# Patient Record
Sex: Female | Born: 2000 | Race: White | Hispanic: No | Marital: Single | Smoking: Never smoker
Health system: Southern US, Community
[De-identification: ages and names within clinical notes are randomized; demographics above are authoritative.]

---
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
## 2023-07-10 ENCOUNTER — Ambulatory Visit
Admission: EM | Admit: 2023-07-10 | Discharge: 2023-07-10 | Disposition: A | Discharge status: Home / Self Care | Payer: No Typology Code available for payment source | Attending: Family Medicine | Admitting: Family Medicine

## 2023-07-10 DIAGNOSIS — H9202 Otalgia, left ear: Secondary | ICD-10-CM

## 2023-07-10 NOTE — ED Provider Notes (Signed)
[facility]    CSN: [id number] Arrival date & time: 07/10/23  1501      History   Chief Complaint Chief Complaint  Patient presents with   Ear Pain    HPI Sarah Gross is a 22 y.o. female.   HPI   Gift presents for left ear pain with associated pressure.  No fever, cold symptoms. No recent swimming. Dad wanted to be sure she didn't have an ear infection prior to Marvin leaving for town tomorrow.  No treatment prior to arrival.    Sarah Gross has otherwise been well and has no other concerns.      History reviewed. No pertinent past medical history.  There are no problems to display for this patient.   History reviewed. No pertinent surgical history.  OB History   No obstetric history on file.      Home Medications    Prior to Admission medications   Medication Sig Start Date End Date Taking? Authorizing Provider  sertraline (ZOLOFT) 50 MG tablet Take 50 mg by mouth daily.    [provider]    Family History History reviewed. No pertinent family history.  Social History Social History   Tobacco Use   Smoking status: Never   Smokeless tobacco: Never  Vaping Use   Vaping status: Never Used  Substance Use Topics   Alcohol use: Never   Drug use: Never     Allergies   Patient has no known allergies.   Review of Systems Review of Systems: :negative unless otherwise stated in HPI.      Physical Exam Triage Vital Signs ED Triage Vitals  Encounter Vitals Group     BP 07/10/23 1514 115/75     Systolic BP Percentile --      Diastolic BP Percentile --      Pulse Rate 07/10/23 1514 92     Resp --      Temp 07/10/23 1514 99.1 F (37.3 C)     Temp Source 07/10/23 1514 Oral     SpO2 07/10/23 1514 97 %     Weight --      Height --      Head Circumference --      Peak Flow --      Pain Score 07/10/23 1512 6     Pain Loc --      Pain Education --      Exclude from Growth Chart --    No data found.  Updated Vital  Signs BP 115/75 (BP Location: Left Arm)   Pulse 92   Temp 99.1 F (37.3 C) (Oral)   LMP 07/08/2023   SpO2 97%   Visual Acuity Right Eye Distance:   Left Eye Distance:   Bilateral Distance:    Right Eye Near:   Left Eye Near:    Bilateral Near:     Physical Exam GEN:     alert, well appearing female in no distress    HENT:  mucus membranes moist, no nasal discharge, right TM effusion but no purulence or , left TM normal, normal external auditory canals bilaterally, nontender tragus, tenderness at TM joint with palpation EYES:   no scleral injection or lesions  NECK:  normal ROM, no lymphadenopathy, no meningismus   RESP:  no increased work of breathing CVS:   regular rate, brisk cap refill  Skin:   warm and dry    UC Treatments / Results  Labs (all labs ordered are listed, but only abnormal results  are displayed) Labs Reviewed - No data to display  EKG   Radiology No results found.  Procedures Procedures (including critical care time)  Medications Ordered in UC Medications - No data to display  Initial Impression / Assessment and Plan / UC Course  I have reviewed the triage vital signs and the nursing notes.  Pertinent labs & imaging results that were available during my care of the patient were reviewed by me and considered in my medical decision making (see chart for details).      Overall patient is well-appearing, well-hydrated and without respiratory distress. Sarah Gross is afebrile. She has no evidence of acute otitis media or externa.  On exam, she has TTP at the TM joint.  Suspect referred pain.  Treat with 400-600 mg Motrin as needed for pain.     Discussed MDM, treatment plan and plan for follow-up with patient and her dad who agree with plan.   Final Clinical Impressions(s) / UC Diagnoses   Final diagnoses:  Left ear pain     Discharge Instructions      You did not have evidence of an outer or inner ear infection.  Take ibuprofen 400-600 mg  3-4 times a day for the next week.  If pain suddenly worsens, go to the ED.  If pain continues, follow up with a ear, nose and throat provider.      ED Prescriptions   None    PDMP not reviewed this encounter.   Katha Cabal, DO 07/10/23 1637

## 2023-07-10 NOTE — Discharge Instructions (Signed)
You did not have evidence of an outer or inner ear infection.  Take ibuprofen 400-600 mg 3-4 times a day for the next week.  If pain suddenly worsens, go to the ED.  If pain continues, follow up with a ear, nose and throat provider.

## 2023-07-10 NOTE — ED Triage Notes (Signed)
Pt is with her father  Pt c/o left ear pain, and left jaw pain x2days   Pt states that her ears feel worse when she swallows and yawns.  Pt denies a sore throat.
